# Patient Record
Sex: Male | Born: 1935 | Race: White | Hispanic: No | Marital: Married
Health system: Southern US, Community
[De-identification: ages and names within clinical notes are randomized; demographics above are authoritative.]

---

## 2005-04-22 ENCOUNTER — Ambulatory Visit: Payer: Self-pay | Admitting: Urology

## 2010-01-28 ENCOUNTER — Ambulatory Visit: Payer: Self-pay | Admitting: Internal Medicine

## 2010-03-13 ENCOUNTER — Ambulatory Visit: Payer: Self-pay | Admitting: Internal Medicine

## 2010-03-19 ENCOUNTER — Ambulatory Visit: Payer: Self-pay | Admitting: Unknown Physician Specialty

## 2010-04-24 ENCOUNTER — Ambulatory Visit: Payer: Self-pay | Admitting: Unknown Physician Specialty

## 2010-06-30 ENCOUNTER — Other Ambulatory Visit: Payer: Self-pay | Admitting: General Surgery

## 2010-07-01 ENCOUNTER — Ambulatory Visit: Payer: Self-pay | Admitting: General Surgery

## 2010-07-03 ENCOUNTER — Ambulatory Visit: Payer: Self-pay | Admitting: General Surgery

## 2011-01-08 ENCOUNTER — Observation Stay: Payer: Self-pay | Admitting: Internal Medicine

## 2011-12-07 ENCOUNTER — Ambulatory Visit: Payer: Self-pay | Admitting: Ophthalmology

## 2012-07-07 ENCOUNTER — Ambulatory Visit: Payer: Self-pay | Admitting: Internal Medicine

## 2013-08-30 ENCOUNTER — Ambulatory Visit: Payer: Self-pay | Admitting: Internal Medicine

## 2014-06-18 ENCOUNTER — Ambulatory Visit: Payer: Self-pay | Admitting: Internal Medicine

## 2014-06-21 ENCOUNTER — Inpatient Hospital Stay: Payer: Self-pay | Admitting: Internal Medicine

## 2014-06-21 LAB — CBC WITH DIFFERENTIAL/PLATELET
Basophil #: 0 10*3/uL (ref 0.0–0.1)
Basophil %: 0.2 %
EOS ABS: 0 10*3/uL (ref 0.0–0.7)
Eosinophil %: 0.1 %
HCT: 43.9 % (ref 40.0–52.0)
HGB: 14 g/dL (ref 13.0–18.0)
Lymphocyte #: 1.4 10*3/uL (ref 1.0–3.6)
Lymphocyte %: 8.8 %
MCH: 30.5 pg (ref 26.0–34.0)
MCHC: 31.9 g/dL — ABNORMAL LOW (ref 32.0–36.0)
MCV: 96 fL (ref 80–100)
MONO ABS: 1.3 x10 3/mm — AB (ref 0.2–1.0)
Monocyte %: 8.3 %
NEUTROS ABS: 12.8 10*3/uL — AB (ref 1.4–6.5)
Neutrophil %: 82.6 %
Platelet: 178 10*3/uL (ref 150–440)
RBC: 4.59 10*6/uL (ref 4.40–5.90)
RDW: 15.6 % — ABNORMAL HIGH (ref 11.5–14.5)
WBC: 15.5 10*3/uL — AB (ref 3.8–10.6)

## 2014-06-21 LAB — COMPREHENSIVE METABOLIC PANEL
ALK PHOS: 79 U/L
ALT: 21 U/L
ANION GAP: 5 — AB (ref 7–16)
AST: 19 U/L (ref 15–37)
Albumin: 3.3 g/dL — ABNORMAL LOW (ref 3.4–5.0)
BUN: 19 mg/dL — AB (ref 7–18)
Bilirubin,Total: 0.5 mg/dL (ref 0.2–1.0)
CO2: 32 mmol/L (ref 21–32)
CREATININE: 0.83 mg/dL (ref 0.60–1.30)
Calcium, Total: 8.7 mg/dL (ref 8.5–10.1)
Chloride: 102 mmol/L (ref 98–107)
EGFR (African American): >60
EGFR (Non-African Amer.): >60
Glucose: 198 mg/dL — ABNORMAL HIGH (ref 65–99)
Osmolality: 285 (ref 275–301)
Potassium: 4.4 mmol/L (ref 3.5–5.1)
SODIUM: 139 mmol/L (ref 136–145)
Total Protein: 6.4 g/dL (ref 6.4–8.2)

## 2014-06-21 LAB — URINALYSIS, COMPLETE
BACTERIA: NONE SEEN
BILIRUBIN, UR: NEGATIVE
BLOOD: NEGATIVE
GLUCOSE, UR: NEGATIVE mg/dL (ref 0–75)
KETONE: NEGATIVE
Leukocyte Esterase: NEGATIVE
NITRITE: NEGATIVE
PROTEIN: NEGATIVE
Ph: 6 (ref 4.5–8.0)
RBC,UR: <1 /HPF (ref 0–5)
Specific Gravity: 1.005 (ref 1.003–1.030)
Squamous Epithelial: NONE SEEN
WBC UR: NONE SEEN /HPF (ref 0–5)

## 2014-06-21 LAB — TROPONIN I: TROPONIN-I: 0.02 ng/mL

## 2014-06-21 LAB — D-DIMER(ARMC): D-DIMER: 418 ng/mL

## 2014-06-22 LAB — BASIC METABOLIC PANEL
Anion Gap: 9 (ref 7–16)
BUN: 15 mg/dL (ref 7–18)
CALCIUM: 8.7 mg/dL (ref 8.5–10.1)
Chloride: 102 mmol/L (ref 98–107)
Co2: 29 mmol/L (ref 21–32)
Creatinine: 0.61 mg/dL (ref 0.60–1.30)
EGFR (African American): >60
GLUCOSE: 146 mg/dL — AB (ref 65–99)
OSMOLALITY: 283 (ref 275–301)
Potassium: 4.5 mmol/L (ref 3.5–5.1)
Sodium: 140 mmol/L (ref 136–145)

## 2014-06-22 LAB — HEMOGLOBIN A1C: Hemoglobin A1C: 8.7 % — ABNORMAL HIGH (ref 4.2–6.3)

## 2014-06-26 LAB — CULTURE, BLOOD (SINGLE)

## 2014-09-18 ENCOUNTER — Ambulatory Visit: Payer: Self-pay | Admitting: Internal Medicine

## 2014-10-13 NOTE — H&P (Signed)
PATIENT NAME:  Chase Molina, DORKO MR#:  [id number] DATE OF BIRTH:  [birth year]  DATE OF ADMISSION:  06/21/2014  REFERRING EMERGENCY ROOM PHYSICIAN:  Dr. Cyril Loosen.   PRIMARY CARE PHYSICIAN: Dr. Arlana Pouch in [place].   PRIMARY GASTROENTEROLOGIST:   Dr. Mechele Collin.   CHIEF COMPLAINT: Right-sided chest pain and lower extremity swelling.   HISTORY OF PRESENT ILLNESS: This 81 year old man with a history of Addison disease, hypertension, diabetes, and coronary artery disease reports that 3 weeks ago he coughed while getting out of bed and had acute onset of right-sided chest pain. He had seen his primary care physician for this and had an x-ray which was negative for any etiology of the pain. He has been trying to take ibuprofen at home with no relief. At 2:00 a.m. this morning he reported that he could not take it anymore and eventually called EMS for transport to the Emergency Room. He has also noticed in the past few weeks worsening swelling of both legs and redness on the left lower extremity. His primary care provider has been treating this cellulitis with doxycycline, he has now undergone two 14 day courses without improvement. He denies fevers, chills, nausea and vomiting. He does recall any purulent drainage. No injury to the leg. The area is not especially painful. He is being admitted for IV antibiotics for treatment of cellulitis which has failed outpatient treatment.   PAST MEDICAL HISTORY:  1.  Hypertension.  2.  Addison disease on chronic prednisone for the past 3 years.  3.  Diabetes mellitus.  4.  Coronary artery disease status post stenting.  5.  History of stomach polyps.  6.  Hyperlipidemia.  7.  Macular degeneration.   PAST SURGICAL HISTORY:  1.  Removal of cyst in his throat.  2.  Back surgery x 3.  3.  Rectal fistula repair x 4.  4.  Cardiac stent.  5.  Removal of stomach polyps.   SOCIAL HISTORY: The patient lives with his wife. He uses a cane or a walker for ambulation. He does not  smoke cigarettes or drink alcohol. He is not currently working.   FAMILY MEDICAL HISTORY: Positive for coronary artery disease and diabetes in his father, breast cancer in his mother. No family history of colon cancer.   ALLERGIES: PENICILLIN, REACTION IS UNKNOWN.   HOME MEDICATIONS:  1. Prednisone 10 mg in the morning and 5 mg in the evening.  2. Pravastatin 40 mg 1 tablet once a day at bedtime.  3. Omeprazole 40 mg 1 tablet once a day.  4. Metoprolol tartrate 1 tablet daily if blood pressure is elevated.  5. Lisinopril 5 mg 1 tablet daily.  6. Levothyroxine 150 mcg 1 tablet once a day.  7. Glimepiride 2 mg 1 tablet once a day.  8. Furosemide 20 mg 1-2 tablets once a day as needed for swelling.  9. Flomax 0.4 mg 1 tablet once a day at bedtime.  10. Doxycycline 100 mg 1 tablet twice a day.  11. Aspirin 81 mg 1 tablet daily.   REVIEW OF SYSTEMS:  CONSTITUTIONAL: Negative for fevers, chills, weakness, or change in weight.  HEENT: Negative for change in hearing or vision, no pain in eyes or ears, no difficulty swallowing, no sore throat.  RESPIRATORY: No shortness of breath, cough, hemoptysis, wheezing, no history of COPD or asthma.  CARDIOVASCULAR: No palpitations, no left-sided chest pain. Positive for right-sided chest pain. Positive for lower extremity edema bilaterally. No orthopnea, no paroxysmal nocturnal dyspnea. No palpitations, no  syncope.  GASTROINTESTINAL: No nausea, vomiting, diarrhea, abdominal pain, constipation, melena or hematochezia.  MUSCULOSKELETAL: Positive for lower extremity edema with swelling and redness in the left lower leg, no tender or swollen joints, no new muscle pains.  NEUROLOGIC: No focal numbness or weakness, no confusion, no headaches, no seizures, no stroke.  PSYCHIATRIC: No uncontrolled depression or anxiety.   PHYSICAL EXAMINATION:  VITAL SIGNS: Temperature 98 degrees, pulse 80, respirations 18, blood pressure 185/99, oxygenation is 96% on room air.   GENERAL: The patient is obese, comfortable in the exam bed.  HEENT: Pupils equal, round, and reactive to light, conjunctivae clear, extraocular motion intact, oral mucous membranes are pink and moist, posterior oropharynx is clear with no edema, exudate, or erythema, good dentition. Trachea is midline, no cervical lymphadenopathy.  RESPIRATORY: Lungs clear to auscultation bilaterally with good air movement.  CARDIOVASCULAR: Regular rate and rhythm, no murmurs, rubs, or gallops, there is 2 + pitting edema to the knees bilaterally, peripheral pulses are 1 +.  ABDOMEN: Obese, soft, nontender, nondistended, bowel sounds are normal, no guarding or rebound.  EXTREMITIES: Range of motion is normal in all extremities, no  swollen or tender joints, there is 2 + edema bilaterally to the knees.  Thyroidectomy.  MUSCULOSKELETAL: Strength is 5 out of 5 throughout.  NEUROLOGIC: Cranial nerves II through XII grossly intact, strength and sensation are intact, tone is normal, nonfocal examination.  PSYCHIATRIC: The patient alert and oriented x 3 with good insight into his clinical conditions.  SKIN: erythema and induration overlying 2+ edema on the anterior left lower leg, there is a 6 cm x 6 cm central area of thicker induration which is circular, no open wounds, no drainage  LABORATORY DATA: Sodium 139, potassium 4.4, chloride 102, bicarbonate 32, BUN 19, creatinine 0.83, glucose 198. LFTs are normal. Troponin is 0.02. White blood cells 15.5, hemoglobin 14.0, platelets 178,000, MCV is 96. UA is negative for signs of infection. D-dimer is 418, which is less than the cutoff of 449.   IMAGING: X-rays of the left leg show diffuse soft tissue swelling, no acute osseous abnormalities. Chest x-ray shows cardiomegaly, markedly low lung volumes, vascular congestion without edema, underlying chronic interstitial coarsening.   ASSESSMENT AND PLAN:  1.  Left lower extremity cellulitis: Blood cultures have been drawn. We  will continue with Ancef as the patient is allergic to penicillin and also start vancomycin. We will start a topical antifungal as the patient's area of erythema is distinctly circular and could be fungal. Start clotrimazole. 2.  Lower extremity edema: This is likely due to chronic prednisone therapy as well as obesity. D- dimer negative. I will get a 2-D echocardiogram as he does have cardiomegaly on his x-ray. We will elevate the legs. Low-sodium diet. Continue with Lasix. If leg edema does not improve I would consider Unna boots for this patient to protect his skin. Check daily weights.  3.  Right rib pain: Chest x-ray of the ribs is negative for any fracture or pneumonia at this site. D-dimer is negative to indicate PE. We will continue with pain control and physical therapy.   4.  Addison's: Continue prednisone.  5.  Diabetes mellitus: Check a hemoglobin A1c and start sliding scale insulin.  6.  Coronary artery disease: The patient does not have any cardiac-type chest pain. We will continue with aspirin.  7.  Hypertension: Blood pressures are well controlled. We will continue with Lasix and lisinopril as well as metoprolol.  8.  Prophylaxis: Heparin for DVT  prophylaxis. No GI prophylaxis at this time.   TIME SPENT ON ADMISSION: 50 minutes.     ____________________________ Ena Dawleyatherine P. Clent RidgesWalsh, MD cpw:bu D: 06/21/2014 19:38:26 ET T: 06/21/2014 20:36:52 ET JOB#: [id number]  cc: Ena Dawleyatherine P. Clent RidgesWalsh, MD, <Dictator> Gale JourneyATHERINE P WALSH MD ELECTRONICALLY SIGNED 06/21/2014 23:53

## 2014-10-14 NOTE — Op Note (Signed)
PATIENT NAME:  Chase SarahCARDEN, Yannis H MR#:  [id number] DATE OF BIRTH:  1926-11-06  DATE OF PROCEDURE:  12/07/2011  PREOPERATIVE DIAGNOSIS: Cataract, right eye.   POSTOPERATIVE DIAGNOSIS: Cataract, right eye.   PROCEDURE PERFORMED: Extracapsular cataract extraction using phacoemulsification with placement Alcon [id number], 20.0- diopter posterior chamber lens, serial number [id number].   SURGEON: Maylon PeppersSteven A. Abimbola Aki, MD  ANESTHESIA: 4% lidocaine and 0.75% Marcaine 50-50 mixture with 10 units/mL of Hylenex added given as peribulbar.   ANESTHESIOLOGIST: Dr. Dimple Caseyice.   COMPLICATIONS: Peribulbar hemorrhage.   ESTIMATED BLOOD LOSS: Less than 1 mL.   DESCRIPTION OF PROCEDURE: The patient was brought to the Operating Room and given IV sedation. He was then given a peribulbar block. With a superior block there was a peribulbar hemorrhage with a tense lid. Pressure was held on the globe until the swelling from the hemorrhage and injection went down. Lids were easily retracted at that point. Patient was then prepped and draped in the usual fashion. The vertical rectus muscles were imbricated using 5-0 silk suture as bridle sutures. A limbal peritomy was carried out for one clock hour superiorly and hemostasis obtained with cautery. A partial thickness scleral groove was made at the posterior surgical limbus and dissected anteriorly into clear cornea with an Alcon crescent knife. The anterior chamber was entered using a paracentesis knife and through the lamellar dissection with a 2.6 mm keratome. DisCoVisc was used to replace the aqueous and a continuous tear circular capsulorrhexis was carried out. Hydrodelineation was used to loosen the nucleus and phacoemulsification was carried out in a divide and conquer technique. Ultrasound time was 1 minute and 25 seconds, average power of 13.2% with a CDE 20.79. Irrigation-aspiration was used to remove the residual cortex. Capsular bag was inflated with DisCoVisc and the  intraocular lens was inserted in the capsular bag using a [employer] shooter. Irrigation/aspiration was used to remove the residual DisCoVisc. Single 10-0 nylon suture was placed across the wound and the wound was inflated with balanced salt. Miostat was injected through the paracentesis track to induce miosis and deepen the chamber. The knot was rotated and buried. The wounds were checked for leaks, none were found. The conjunctiva was closed with cautery. The bridle sutures were removed. Three drops of Vigamox were placed on the eye. The patient was discharged to the recovery room in good condition with a shield.   ____________________________ Maylon PeppersSteven A. Carlinda Ohlson, MD sad:cms D: 12/07/2011 13:07:22 ET T: 12/07/2011 13:16:50 ET JOB#: [id number]  cc: Viviann SpareSteven A. Anaya Bovee, MD, <Dictator> Erline LevineSTEVEN A Cherrise Occhipinti MD ELECTRONICALLY SIGNED 12/11/2011 12:07

## 2014-10-17 NOTE — Discharge Summary (Signed)
PATIENT NAME:  Chase Molina, Chase Molina MR#:  [id number] DATE OF BIRTH:  09/13/1926  DATE OF ADMISSION:  06/21/2014 DATE OF DISCHARGE:  06/23/2014  PRIMARY CARE PHYSICIAN: Katherina Rightenny C. Arlana Pouchate, MD  DISCHARGE DIAGNOSES: Left leg cellulitis, coronary artery disease, hypertension, Addison disease, obesity.   CONDITION: Stable.   CODE STATUS: Full Code.   HOME MEDICATIONS: Refer to the medication reconciliation list.   DIET: Low-sodium, low-fat, low-cholesterol diet.   ACTIVITY: As tolerated.   FOLLOWUP CARE: With PCP within 1-2 weeks.   REASON FOR ADMISSION: Right-sided chest pain and lower extremities swelling.   HOSPITAL COURSE: The patient is an 81 year old gentleman with a history of hypertension, diabetes, CAD, Addison disease, presented to the ED with lower extremity swelling and right-sided chest pain. The patient was recently diagnosed with leg cellulitis treated with doxycycline without improvement. For detailed history and physical examination, please refer to the admission note dictated by Dr. Elby Showersatherine Walsh. On the admission date, the patient's x-ray of the left leg showed diffuse soft tissue swelling; no acute bone abnormality. Chest x-ray showed cardiomegaly, vascular congestion without edema.  1.  For left leg cellulitis, after admission the patient has been treated with vancomycin and Ancef. Ancef was discontinued. The patient's left lower extremity erythema, tenderness, and swelling are better. He has no fever or chills. The patient has a leukocytosis of 15, but the patient had a hard stick and he refused to get laboratories again. The patient will continue oral bactrim after discharge. 2.  The patient has leg edema. The patient got an echocardiograph, which showed a normal ejection fraction of 60% to 65%.  3.  Right rib pain. The patient's chest x-ray did not show any fracture.  4.  Diabetes, on sliding scale.  5.  Hypertension, controlled with Lasix, lisinopril, and Lopressor.  The  patient's symptoms have much improved. He has no complaints. According to physical therapy evaluation, the patient needs home health.   I discussed the patient's discharge plan with the patient's nurse. The patient will be discharged to home with home health today.   TIME SPENT: About 38 minutes.   ____________________________ Shaune PollackQing Basya Casavant, MD qc:MT D: 06/23/2014 12:07:21 ET T: 06/23/2014 12:45:39 ET JOB#: [id number]  cc: Shaune PollackQing Amado Andal, MD, <Dictator> Shaune PollackQING Francenia Chimenti MD ELECTRONICALLY SIGNED 06/24/2014 15:56

## 2014-12-21 DEATH — deceased

## 2015-07-27 IMAGING — CR DG TIBIA/FIBULA 2V*L*
1 series · 2 of 2 positions shown · non-contrast
Comparison: None.

CLINICAL DATA: Swelling and redness.

EXAM:
LEFT TIBIA AND FIBULA - 2 VIEW

[Series 1: ap · 0.17mm/px · 2 of 2 slices shown]
[im 1/2]
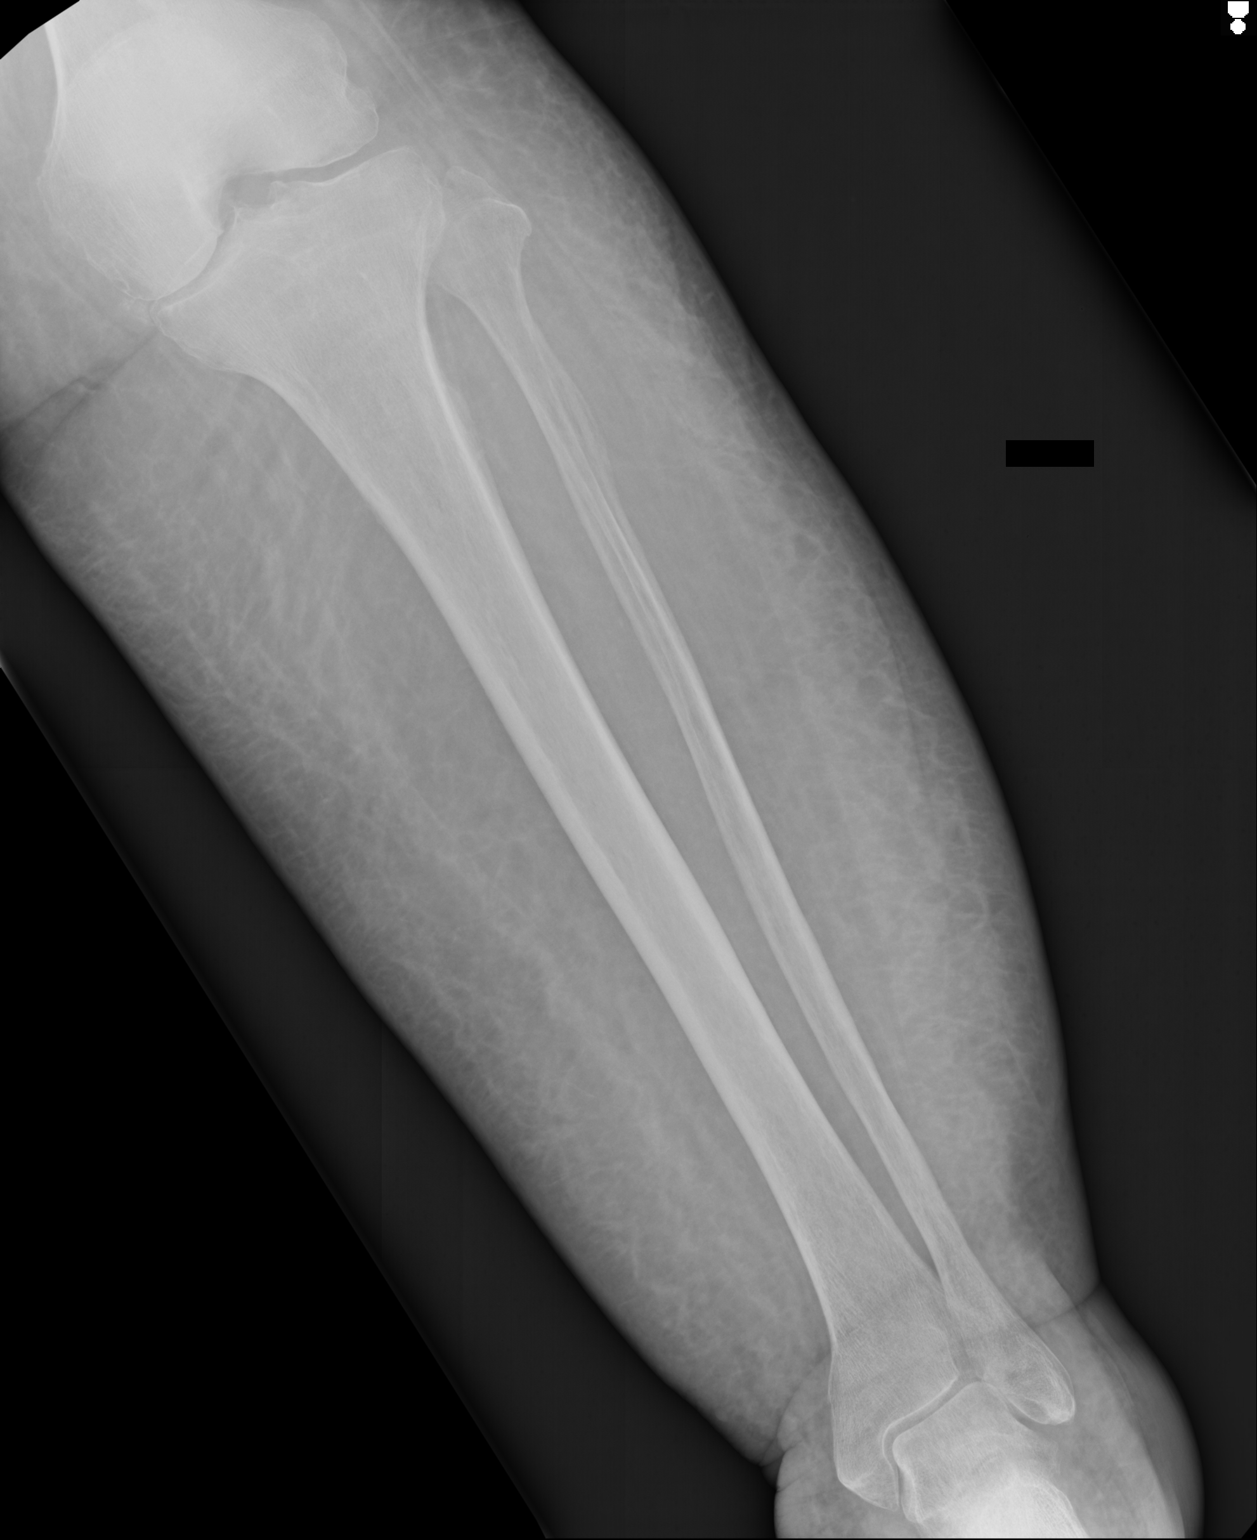
[im 2/2]
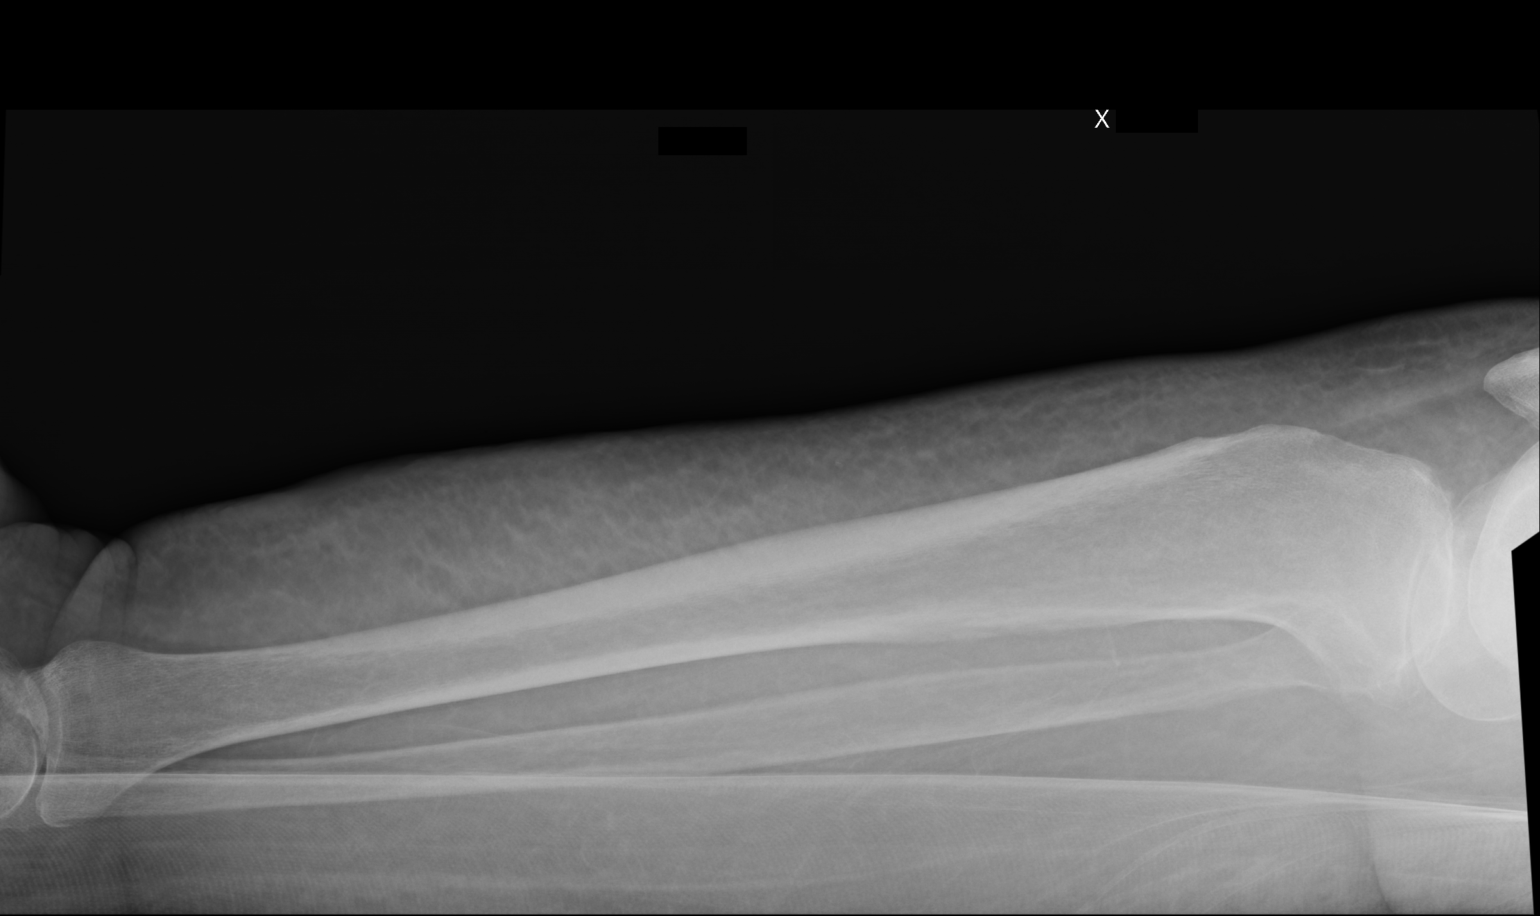

[2 of 2 positions shown; findings below may reference images not displayed]

FINDINGS: Diffuse soft tissue swelling.  No acute osseous abnormality.
IMPRESSION: Diffuse soft tissue swelling.  No acute osseous abnormality.
